# Patient Record
Sex: Female | Born: 1995 | Race: White | Hispanic: No | Marital: Single
Health system: Southern US, Community
[De-identification: ages and names within clinical notes are randomized; demographics above are authoritative.]

---
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
## 2015-06-15 IMAGING — CR DG THORACOLUMBAR SPINE STANDING SCOLIOSIS
1 series · 3 of 3 positions shown · non-contrast
Comparison: None.

CLINICAL DATA: Scoliosis.

EXAM:
THORACOLUMBAR SCOLIOSIS STUDY - STANDING VIEWS

[Series 1001: view not recorded · 0.40mm/px · 3 of 3 slices shown]
[im 1/3]
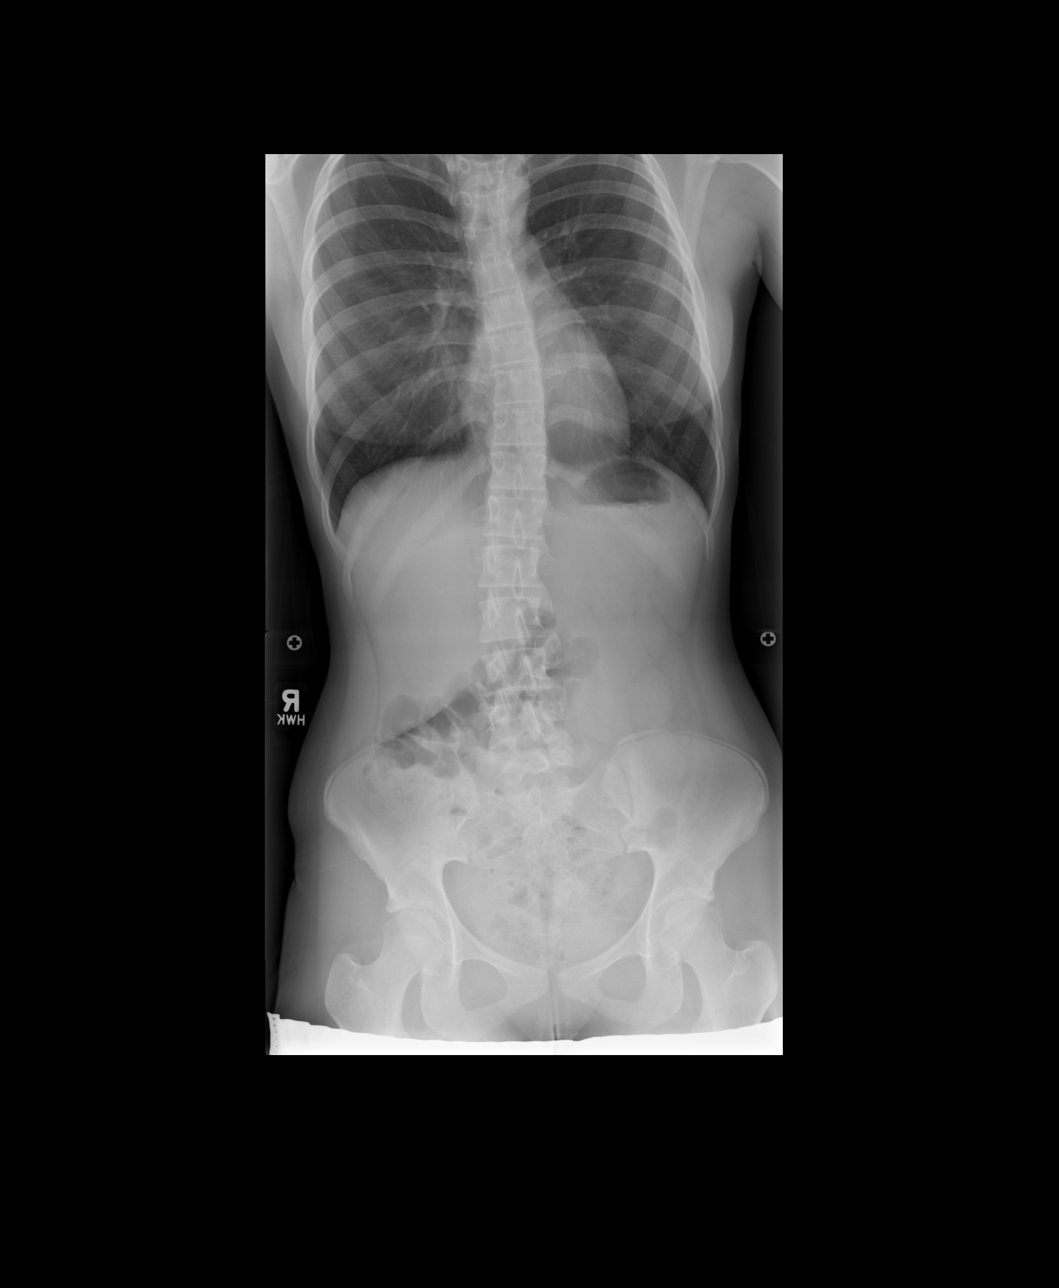
[im 2/3]
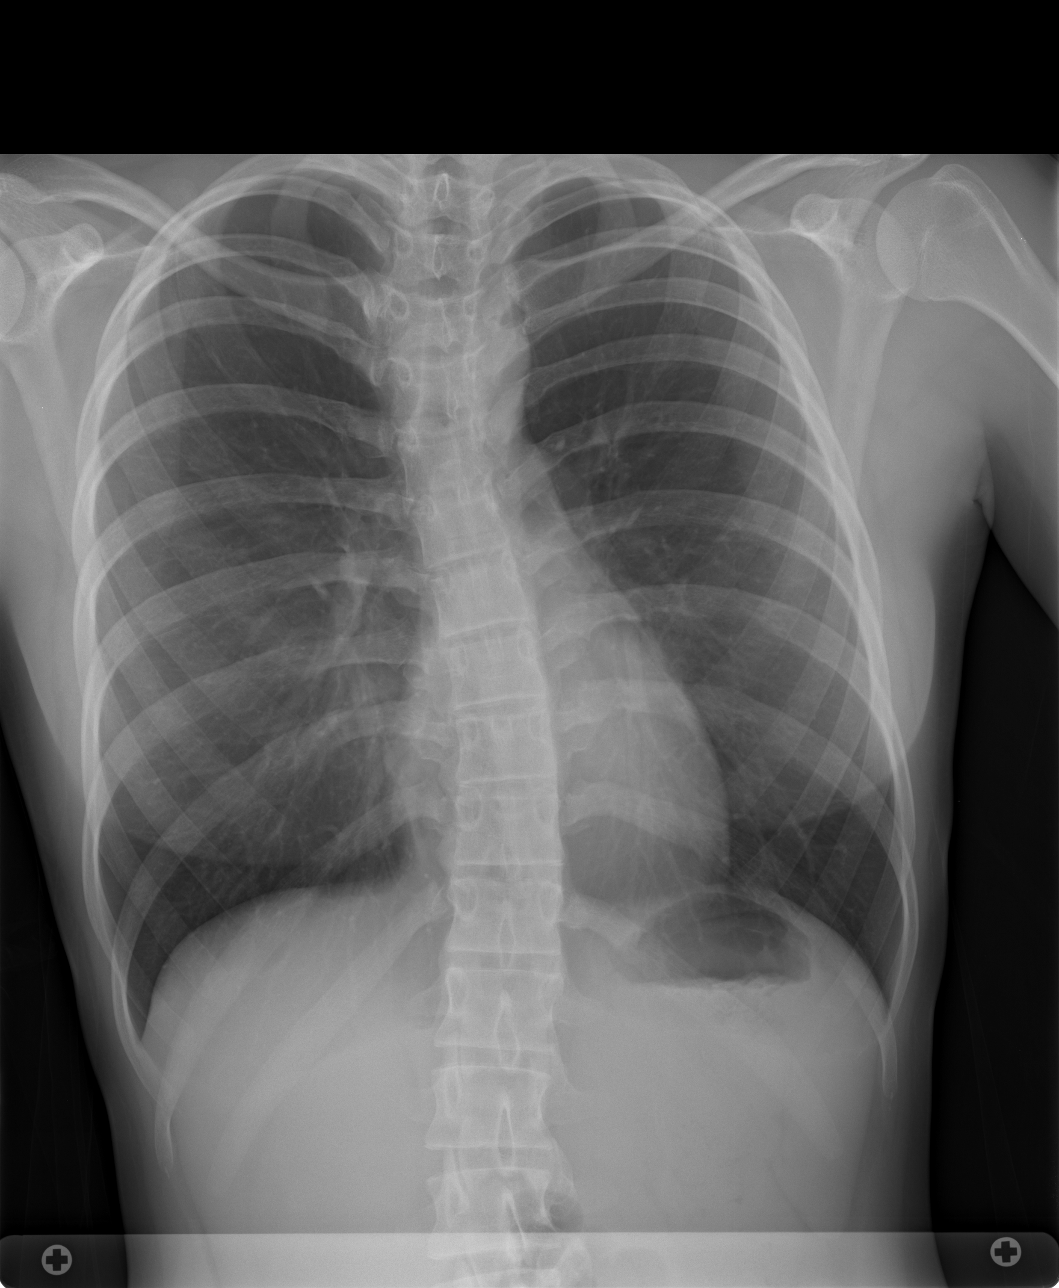
[im 3/3]
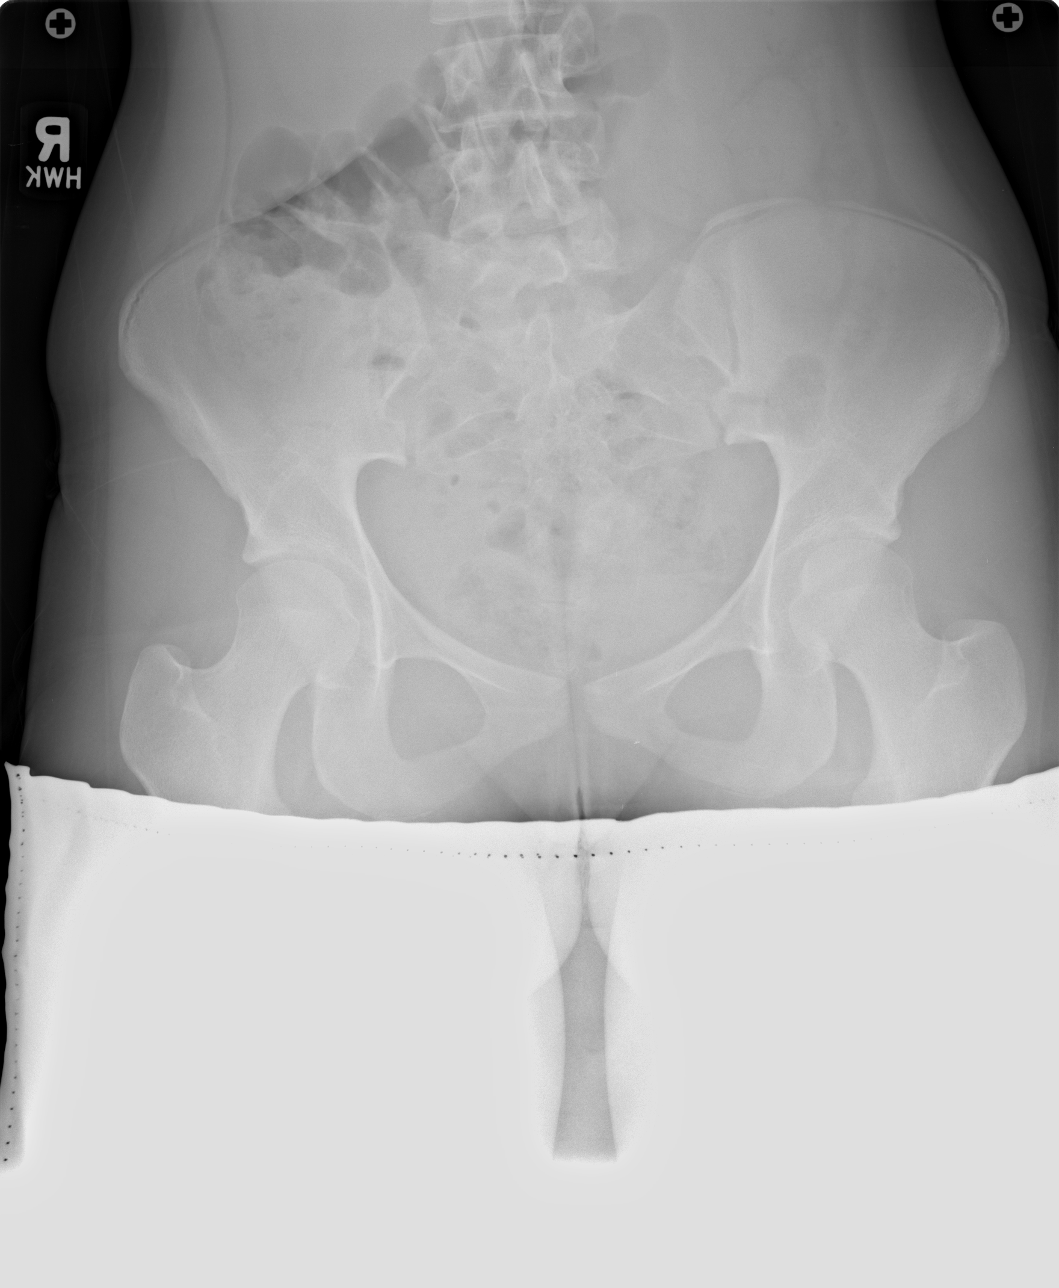

[3 of 3 positions shown; findings below may reference images not displayed]

FINDINGS: There is subtle biphasic curvature of the thoracolumbar spine with
primary curvature of the upper thoracic spine convex to the right
secondary curvature of the lower thoracic spine convex to the left.
The angle of curvature of the upper thoracic spine is 18 degrees
measured from the superior endplate of T3 to the inferior endplate
of T7. The angle curvature of the lower thoracic spine is 17 degrees
measured from the superior endplate of T8-8 to the inferior endplate
of L1. The vertebral body heights and disc spaces are unremarkable.
Pedicles are intact. Remainder of the exam is unremarkable.
IMPRESSION: Mild biphasic curvature of the thoracolumbar spine with primary
curvature of the upper thoracic spine convex to the right measuring
18 degrees from T3 to T7. Secondary curvature of the lower thoracic
spine convex to the left measuring 17 degrees from T8 to L1.
